# Patient Record
Sex: Male | Born: 1975 | Hispanic: No | Marital: Married | Smoking: Former smoker
Health system: Southern US, Community
[De-identification: ages and names within clinical notes are randomized; demographics above are authoritative.]

---

## 2003-03-14 ENCOUNTER — Encounter: Admission: RE | Admit: 2003-03-14 | Discharge: 2003-03-14 | Payer: Self-pay | Admitting: Specialist

## 2003-03-14 ENCOUNTER — Encounter: Payer: Self-pay | Admitting: Specialist

## 2015-05-16 ENCOUNTER — Encounter (HOSPITAL_COMMUNITY)
Admission: EM | Disposition: A | Discharge status: Home / Self Care | Payer: Self-pay | Source: Home / Self Care | Attending: Emergency Medicine

## 2015-05-16 ENCOUNTER — Emergency Department (HOSPITAL_COMMUNITY): Payer: Worker's Compensation | Admitting: Certified Registered Nurse Anesthetist

## 2015-05-16 ENCOUNTER — Emergency Department (HOSPITAL_COMMUNITY): Payer: Worker's Compensation

## 2015-05-16 ENCOUNTER — Encounter (HOSPITAL_COMMUNITY): Payer: Self-pay | Admitting: *Deleted

## 2015-05-16 ENCOUNTER — Ambulatory Visit (HOSPITAL_COMMUNITY)
Admission: EM | Admit: 2015-05-16 | Discharge: 2015-05-16 | Disposition: A | Discharge status: Home / Self Care | Payer: Worker's Compensation | Attending: Emergency Medicine | Admitting: Emergency Medicine

## 2015-05-16 DIAGNOSIS — S61214A Laceration without foreign body of right ring finger without damage to nail, initial encounter: Secondary | ICD-10-CM | POA: Diagnosis not present

## 2015-05-16 DIAGNOSIS — S61212A Laceration without foreign body of right middle finger without damage to nail, initial encounter: Secondary | ICD-10-CM | POA: Diagnosis not present

## 2015-05-16 DIAGNOSIS — Z87891 Personal history of nicotine dependence: Secondary | ICD-10-CM | POA: Diagnosis not present

## 2015-05-16 DIAGNOSIS — S61216A Laceration without foreign body of right little finger without damage to nail, initial encounter: Secondary | ICD-10-CM | POA: Diagnosis not present

## 2015-05-16 DIAGNOSIS — S61411A Laceration without foreign body of right hand, initial encounter: Secondary | ICD-10-CM

## 2015-05-16 DIAGNOSIS — W319XXA Contact with unspecified machinery, initial encounter: Secondary | ICD-10-CM | POA: Insufficient documentation

## 2015-05-16 DIAGNOSIS — S62616A Displaced fracture of proximal phalanx of right little finger, initial encounter for closed fracture: Secondary | ICD-10-CM | POA: Insufficient documentation

## 2015-05-16 DIAGNOSIS — S61111A Laceration without foreign body of right thumb with damage to nail, initial encounter: Secondary | ICD-10-CM | POA: Diagnosis not present

## 2015-05-16 DIAGNOSIS — S63692A Other sprain of right middle finger, initial encounter: Secondary | ICD-10-CM | POA: Insufficient documentation

## 2015-05-16 DIAGNOSIS — Z23 Encounter for immunization: Secondary | ICD-10-CM | POA: Insufficient documentation

## 2015-05-16 DIAGNOSIS — S61011A Laceration without foreign body of right thumb without damage to nail, initial encounter: Secondary | ICD-10-CM | POA: Diagnosis present

## 2015-05-16 HISTORY — PX: I&D EXTREMITY: SHX5045

## 2015-05-16 LAB — CBC WITH DIFFERENTIAL/PLATELET
BASOS ABS: 0 10*3/uL (ref 0.0–0.1)
BASOS PCT: 0 %
EOS ABS: 0.2 10*3/uL (ref 0.0–0.7)
EOS PCT: 4 %
HCT: 43 % (ref 39.0–52.0)
Hemoglobin: 14.2 g/dL (ref 13.0–17.0)
Lymphocytes Relative: 56 %
Lymphs Abs: 3.7 10*3/uL (ref 0.7–4.0)
MCH: 28.5 pg (ref 26.0–34.0)
MCHC: 33 g/dL (ref 30.0–36.0)
MCV: 86.3 fL (ref 78.0–100.0)
MONO ABS: 0.4 10*3/uL (ref 0.1–1.0)
Monocytes Relative: 6 %
Neutro Abs: 2.2 10*3/uL (ref 1.7–7.7)
Neutrophils Relative %: 34 %
PLATELETS: 201 10*3/uL (ref 150–400)
RBC: 4.98 MIL/uL (ref 4.22–5.81)
RDW: 13 % (ref 11.5–15.5)
WBC: 6.6 10*3/uL (ref 4.0–10.5)

## 2015-05-16 LAB — BASIC METABOLIC PANEL
Anion gap: 10 (ref 5–15)
BUN: 14 mg/dL (ref 6–20)
CHLORIDE: 101 mmol/L (ref 101–111)
CO2: 26 mmol/L (ref 22–32)
CREATININE: 0.9 mg/dL (ref 0.61–1.24)
Calcium: 9.8 mg/dL (ref 8.9–10.3)
GFR calc Af Amer: >60 mL/min (ref >60)
GFR calc non Af Amer: >60 mL/min (ref >60)
Glucose, Bld: 114 mg/dL — ABNORMAL HIGH (ref 65–99)
Potassium: 3.3 mmol/L — ABNORMAL LOW (ref 3.5–5.1)
SODIUM: 137 mmol/L (ref 135–145)

## 2015-05-16 SURGERY — IRRIGATION AND DEBRIDEMENT EXTREMITY
Anesthesia: General | Site: Hand | Laterality: Right

## 2015-05-16 MED ORDER — FENTANYL CITRATE (PF) 250 MCG/5ML IJ SOLN
INTRAMUSCULAR | Status: AC
  Filled 2015-05-16: qty 5

## 2015-05-16 MED ORDER — LACTATED RINGERS IV SOLN
INTRAVENOUS | Status: DC
  Administered 2015-05-16: 17:00:00 via INTRAVENOUS

## 2015-05-16 MED ORDER — FENTANYL CITRATE (PF) 250 MCG/5ML IJ SOLN
INTRAMUSCULAR | Status: DC | PRN
  Administered 2015-05-16: 100 ug via INTRAVENOUS
  Administered 2015-05-16: 150 ug via INTRAVENOUS

## 2015-05-16 MED ORDER — HYDROMORPHONE HCL 1 MG/ML IJ SOLN
0.2500 mg | INTRAMUSCULAR | Status: DC | PRN
  Administered 2015-05-16 (×3): 0.5 mg via INTRAVENOUS

## 2015-05-16 MED ORDER — ONDANSETRON HCL 4 MG/2ML IJ SOLN
INTRAMUSCULAR | Status: AC
  Filled 2015-05-16: qty 2

## 2015-05-16 MED ORDER — TETANUS-DIPHTH-ACELL PERTUSSIS 5-2.5-18.5 LF-MCG/0.5 IM SUSP
0.5000 mL | Freq: Once | INTRAMUSCULAR | Status: AC
  Administered 2015-05-16: 0.5 mL via INTRAMUSCULAR
  Filled 2015-05-16: qty 0.5

## 2015-05-16 MED ORDER — LACTATED RINGERS IV SOLN
INTRAVENOUS | Status: DC | PRN
  Administered 2015-05-16 (×2): via INTRAVENOUS

## 2015-05-16 MED ORDER — PROPOFOL 10 MG/ML IV BOLUS
INTRAVENOUS | Status: DC | PRN
  Administered 2015-05-16: 200 mg via INTRAVENOUS

## 2015-05-16 MED ORDER — ONDANSETRON HCL 4 MG/2ML IJ SOLN
INTRAMUSCULAR | Status: DC | PRN
  Administered 2015-05-16: 4 mg via INTRAVENOUS

## 2015-05-16 MED ORDER — PROPOFOL 10 MG/ML IV BOLUS
INTRAVENOUS | Status: AC
  Filled 2015-05-16: qty 20

## 2015-05-16 MED ORDER — LIDOCAINE HCL (CARDIAC) 20 MG/ML IV SOLN
INTRAVENOUS | Status: AC
  Filled 2015-05-16: qty 5

## 2015-05-16 MED ORDER — LIDOCAINE HCL (CARDIAC) 20 MG/ML IV SOLN
INTRAVENOUS | Status: DC | PRN
  Administered 2015-05-16: 100 mg via INTRAVENOUS

## 2015-05-16 MED ORDER — HYDROMORPHONE HCL 1 MG/ML IJ SOLN
INTRAMUSCULAR | Status: AC
  Filled 2015-05-16: qty 1

## 2015-05-16 MED ORDER — MIDAZOLAM HCL 2 MG/2ML IJ SOLN
INTRAMUSCULAR | Status: AC
  Filled 2015-05-16: qty 4

## 2015-05-16 MED ORDER — CEFAZOLIN SODIUM 1-5 GM-% IV SOLN
1.0000 g | Freq: Once | INTRAVENOUS | Status: AC
  Administered 2015-05-16: 1 g via INTRAVENOUS
  Administered 2015-05-16: 2 g via INTRAVENOUS
  Filled 2015-05-16: qty 50

## 2015-05-16 MED ORDER — BUPIVACAINE HCL (PF) 0.25 % IJ SOLN
INTRAMUSCULAR | Status: DC | PRN
Start: 2015-05-16 — End: 2015-05-16
  Administered 2015-05-16: 8 mL

## 2015-05-16 MED ORDER — OXYCODONE-ACETAMINOPHEN 5-325 MG PO TABS: 1.0000 | ORAL_TABLET | ORAL | Status: AC | PRN

## 2015-05-16 MED ORDER — SODIUM CHLORIDE 0.9 % IR SOLN
Status: DC | PRN
  Administered 2015-05-16: 3000 mL
  Administered 2015-05-16: 1000 mL

## 2015-05-16 MED ORDER — SCOPOLAMINE 1 MG/3DAYS TD PT72
MEDICATED_PATCH | TRANSDERMAL | Status: DC | PRN
  Administered 2015-05-16: 1 via TRANSDERMAL

## 2015-05-16 MED ORDER — HYDROMORPHONE HCL 1 MG/ML IJ SOLN
1.0000 mg | Freq: Once | INTRAMUSCULAR | Status: AC
  Administered 2015-05-16: 1 mg via INTRAVENOUS
  Filled 2015-05-16: qty 1

## 2015-05-16 MED ORDER — MEPERIDINE HCL 25 MG/ML IJ SOLN: 6.2500 mg | INTRAMUSCULAR | Status: DC | PRN

## 2015-05-16 MED ORDER — BUPIVACAINE HCL (PF) 0.25 % IJ SOLN
INTRAMUSCULAR | Status: AC
  Filled 2015-05-16: qty 30

## 2015-05-16 MED ORDER — SCOPOLAMINE 1 MG/3DAYS TD PT72
MEDICATED_PATCH | TRANSDERMAL | Status: AC
  Filled 2015-05-16: qty 1

## 2015-05-16 MED ORDER — SUCCINYLCHOLINE CHLORIDE 20 MG/ML IJ SOLN
INTRAMUSCULAR | Status: AC
  Filled 2015-05-16: qty 1

## 2015-05-16 MED ORDER — MIDAZOLAM HCL 2 MG/2ML IJ SOLN
INTRAMUSCULAR | Status: DC | PRN
  Administered 2015-05-16: 2 mg via INTRAVENOUS

## 2015-05-16 MED ORDER — ONDANSETRON HCL 4 MG/2ML IJ SOLN
4.0000 mg | Freq: Once | INTRAMUSCULAR | Status: AC
  Administered 2015-05-16: 4 mg via INTRAVENOUS
  Filled 2015-05-16: qty 2

## 2015-05-16 MED ORDER — PROMETHAZINE HCL 25 MG/ML IJ SOLN: 6.2500 mg | INTRAMUSCULAR | Status: DC | PRN

## 2015-05-16 MED ORDER — SUCCINYLCHOLINE CHLORIDE 20 MG/ML IJ SOLN
INTRAMUSCULAR | Status: DC | PRN
  Administered 2015-05-16: 100 mg via INTRAVENOUS

## 2015-05-16 SURGICAL SUPPLY — 64 items
BANDAGE ELASTIC 3 VELCRO ST LF (GAUZE/BANDAGES/DRESSINGS) ×2 IMPLANT
BANDAGE ELASTIC 4 VELCRO ST LF (GAUZE/BANDAGES/DRESSINGS) ×2 IMPLANT
BNDG CMPR 9X4 STRL LF SNTH (GAUZE/BANDAGES/DRESSINGS)
BNDG CONFORM 2 STRL LF (GAUZE/BANDAGES/DRESSINGS) IMPLANT
BNDG ESMARK 4X9 LF (GAUZE/BANDAGES/DRESSINGS) IMPLANT
BNDG GAUZE ELAST 4 BULKY (GAUZE/BANDAGES/DRESSINGS) ×2 IMPLANT
CORDS BIPOLAR (ELECTRODE) ×2 IMPLANT
COVER SURGICAL LIGHT HANDLE (MISCELLANEOUS) ×4 IMPLANT
CUFF TOURNIQUET SINGLE 18IN (TOURNIQUET CUFF) ×2 IMPLANT
DECANTER SPIKE VIAL GLASS SM (MISCELLANEOUS) ×2 IMPLANT
DRAIN PENROSE 1/4X12 LTX STRL (WOUND CARE) IMPLANT
DRAPE SURG 17X23 STRL (DRAPES) ×2 IMPLANT
DRSG PAD ABDOMINAL 8X10 ST (GAUZE/BANDAGES/DRESSINGS) IMPLANT
DURAPREP 26ML APPLICATOR (WOUND CARE) IMPLANT
ELECT REM PT RETURN 9FT ADLT (ELECTROSURGICAL)
ELECTRODE REM PT RTRN 9FT ADLT (ELECTROSURGICAL) IMPLANT
GAUZE PACKING IODOFORM 1/4X5 (PACKING) IMPLANT
GAUZE SPONGE 4X4 12PLY STRL (GAUZE/BANDAGES/DRESSINGS) IMPLANT
GAUZE XEROFORM 1X8 LF (GAUZE/BANDAGES/DRESSINGS) IMPLANT
GAUZE XEROFORM 5X9 LF (GAUZE/BANDAGES/DRESSINGS) ×2 IMPLANT
GLOVE SURG SYN 8.0 (GLOVE) ×4 IMPLANT
GOWN STRL REUS W/ TWL LRG LVL3 (GOWN DISPOSABLE) ×1 IMPLANT
GOWN STRL REUS W/ TWL XL LVL3 (GOWN DISPOSABLE) ×1 IMPLANT
GOWN STRL REUS W/TWL LRG LVL3 (GOWN DISPOSABLE) ×2
GOWN STRL REUS W/TWL XL LVL3 (GOWN DISPOSABLE) ×2
HANDPIECE INTERPULSE COAX TIP (DISPOSABLE)
K-WIRE 1.1 (WIRE) ×4
K-WIRE FX150X1.1XTROC TIP (WIRE) ×2
KIT BASIN OR (CUSTOM PROCEDURE TRAY) ×2 IMPLANT
KIT ROOM TURNOVER OR (KITS) ×2 IMPLANT
KWIRE FX150X1.1XTROC TIP (WIRE) ×2 IMPLANT
MANIFOLD NEPTUNE II (INSTRUMENTS) ×2 IMPLANT
NEEDLE HYPO 25GX1X1/2 BEV (NEEDLE) IMPLANT
NEEDLE HYPO 25X1 1.5 SAFETY (NEEDLE) IMPLANT
NS IRRIG 1000ML POUR BTL (IV SOLUTION) ×2 IMPLANT
PACK ORTHO EXTREMITY (CUSTOM PROCEDURE TRAY) ×2 IMPLANT
PAD ARMBOARD 7.5X6 YLW CONV (MISCELLANEOUS) ×4 IMPLANT
PAD CAST 3X4 CTTN HI CHSV (CAST SUPPLIES) ×1 IMPLANT
PAD CAST 4YDX4 CTTN HI CHSV (CAST SUPPLIES) ×1 IMPLANT
PADDING CAST COTTON 3X4 STRL (CAST SUPPLIES) ×2
PADDING CAST COTTON 4X4 STRL (CAST SUPPLIES) ×2
PLATE T 1.5MM 11H 40MM (Plate) ×2 IMPLANT
SCREW 1.5X10MM (Screw) ×4 IMPLANT
SCREW 1.5X12MM (Screw) ×2 IMPLANT
SCREW 1.5X8MM (Screw) ×4 IMPLANT
SET HNDPC FAN SPRY TIP SCT (DISPOSABLE) IMPLANT
SPLINT PLASTER CAST XFAST 4X15 (CAST SUPPLIES) ×1 IMPLANT
SPLINT PLASTER XTRA FAST SET 4 (CAST SUPPLIES) ×1
SPONGE LAP 18X18 X RAY DECT (DISPOSABLE) IMPLANT
SUCTION FRAZIER TIP 10 FR DISP (SUCTIONS) ×2 IMPLANT
SUT CHROMIC 6 0 PS 4 (SUTURE) ×2 IMPLANT
SUT MERSILENE 4 0 P 3 (SUTURE) ×2 IMPLANT
SUT VIC AB 2-0 FS1 27 (SUTURE) ×2 IMPLANT
SUT VICRYL 6 0 UNDY PS 6 (SUTURE) ×2 IMPLANT
SUT VICRYL RAPIDE 4/0 PS 2 (SUTURE) ×12 IMPLANT
SYR 20CC LL (SYRINGE) IMPLANT
SYR CONTROL 10ML LL (SYRINGE) IMPLANT
TOWEL OR 17X24 6PK STRL BLUE (TOWEL DISPOSABLE) ×2 IMPLANT
TOWEL OR 17X26 10 PK STRL BLUE (TOWEL DISPOSABLE) ×2 IMPLANT
TUBE ANAEROBIC SPECIMEN COL (MISCELLANEOUS) IMPLANT
TUBE CONNECTING 12X1/4 (SUCTIONS) ×4 IMPLANT
UNDERPAD 30X30 INCONTINENT (UNDERPADS AND DIAPERS) ×2 IMPLANT
WATER STERILE IRR 1000ML POUR (IV SOLUTION) ×2 IMPLANT
YANKAUER SUCT BULB TIP NO VENT (SUCTIONS) ×2 IMPLANT

## 2015-05-16 NOTE — Anesthesia Preprocedure Evaluation (Addendum)
Anesthesia Evaluation  Patient identified by MRN, date of birth, ID band Patient awake    Reviewed: Allergy & Precautions, NPO status , Patient's Chart, lab work & pertinent test results  Airway Mallampati: II  TM Distance: >3 FB Neck ROM: Full    Dental no notable dental hx. (+) Teeth Intact, Dental Advisory Given   Pulmonary former smoker,    Pulmonary exam normal breath sounds clear to auscultation       Cardiovascular Normal cardiovascular exam Rhythm:Regular Rate:Normal     Neuro/Psych    GI/Hepatic   Endo/Other    Renal/GU      Musculoskeletal   Abdominal   Peds  Hematology   Anesthesia Other Findings   Reproductive/Obstetrics                           Anesthesia Physical Anesthesia Plan  ASA: II and emergent  Anesthesia Plan: General   Post-op Pain Management:    Induction: Intravenous  Airway Management Planned: Oral ETT  Additional Equipment:   Intra-op Plan:   Post-operative Plan: Extubation in OR  Informed Consent: I have reviewed the patients History and Physical, chart, labs and discussed the procedure including the risks, benefits and alternatives for the proposed anesthesia with the patient or authorized representative who has indicated his/her understanding and acceptance.   Dental advisory given  Plan Discussed with: CRNA  Anesthesia Plan Comments:         Anesthesia Quick Evaluation

## 2015-05-16 NOTE — Anesthesia Procedure Notes (Signed)
Procedure Name: Intubation Date/Time: 05/16/2015 5:37 PM Performed by: Alanda AmassFRIEDMAN, Na Waldrip A Pre-anesthesia Checklist: Patient identified, Emergency Drugs available, Suction available, Patient being monitored and Timeout performed Patient Re-evaluated:Patient Re-evaluated prior to inductionOxygen Delivery Method: Circle system utilized Preoxygenation: Pre-oxygenation with 100% oxygen Intubation Type: IV induction, Rapid sequence and Cricoid Pressure applied Tube type: Oral Tube size: 7.5 mm Number of attempts: 1 Airway Equipment and Method: Stylet Placement Confirmation: ETT inserted through vocal cords under direct vision,  positive ETCO2 and breath sounds checked- equal and bilateral Secured at: 22 cm Tube secured with: Tape Dental Injury: Teeth and Oropharynx as per pre-operative assessment

## 2015-05-16 NOTE — Transfer of Care (Signed)
Immediate Anesthesia Transfer of Care Note  Patient: William Case  Procedure(s) Performed: Procedure(s): EXPLORE AS NEEDED HAND, ORIF RIGHT PROXIMAL FIFTH PHALANX FRACTURE (Right)  Patient Location: PACU  Anesthesia Type:General  Level of Consciousness: sedated  Airway & Oxygen Therapy: Patient Spontanous Breathing and Patient connected to face mask oxygen  Post-op Assessment: Report given to RN and Post -op Vital signs reviewed and stable  Post vital signs: Reviewed and stable  Last Vitals:  Filed Vitals:   05/16/15 1700  BP: 141/73  Pulse: 78  Temp:   Resp:     Complications: No apparent anesthesia complications

## 2015-05-16 NOTE — Op Note (Signed)
[name], William Case             ACCOUNT NO.:  [id number]  MEDICAL RECORD NO.:  [id number]  LOCATION:  [facility]                         FACILITY:  [facility]  PHYSICIAN:  Artist Pais. Astraea Gaughran, M.D.DATE OF BIRTH:  1976/06/18  DATE OF PROCEDURE:  05/16/2015 DATE OF DISCHARGE:                              OPERATIVE REPORT   PREOPERATIVE DIAGNOSIS:  Degloving injury, right hand involving thumb, long, ring, and small fingers.  POSTOPERATIVE DIAGNOSIS:  Degloving injury, right hand involving thumb, long, ring, and small fingers.  PROCEDURES:  I and D above with exploration and repair, thumb, long, ring, and small.  Thumb is exploration with nail bed repair and complex wound closure.  Index nothing.  Long finger is complex wound closure with repair of A2 and A4 pulleys.  Ring is simple wound closure, and small is complex wound closure with open reduction internal fixation of displaced proximal phalangeal fracture with 1.5 mm T-plate.  SURGEON:  Artist Pais. Mina Marble, M.D.  ASSISTANT:  None.  ANESTHESIA:  General.  TOURNIQUET TIME:  2 hours and 5 minutes.  COMPLICATIONS:  No complications.  DRAINS:  No drains.  DESCRIPTION OF PROCEDURE:  The patient was taken to the operating suite after induction of general anesthetic.  Right upper extremity was prepped and draped in sterile fashion.  An Esmarch was used to exsanguinate the limb.  Tourniquet was then inflated to 250 mmHg.  At this point in time, we explored the right hand.  The thumb had a nail plate injury with avulsion of the nail plate from under the eponychial fold, and a nail bed laceration transversely in the distal third, and a distally based flap of skin volarly with intact neurovascular bundles, intact flexor tendon, complex wound.  This wound was irrigated.  We loosely approximated the skin edges with 4-0 Vicryl Rapide.  We removed the nail plate from under the eponychial fold, nail bed, repaired the nail bed with 6-0 Vicryl,  sutured the nail plate back onto the eponychial fold with 4-0 Vicryl Rapide.  Index finger had no injury. Long finger had a complex wound with disruption of both the A4 and A2 pulleys.  We thoroughly irrigated this wound.  Neurovascular bundles were identified and retracted.  The pulley was repaired with 4-0 Mersilene; and then, a complex wound closure was performed using 4-0 Vicryl Rapide.  The ring finger had a complex wound along the base of the proximal phalanx which was explored.  The ulnar neurovascular bundle was intact, and this was repaired with 4-0 Vicryl Rapide.  The small finger had a complex volar laceration which was explored.  The neurovascular bundle was intact.  There was an open fracture of the proximal phalanx.  This wound was then also closed with 4-0 Vicryl Rapide.  Multiple attempts were made to try and reduce the fracture with closed percutaneous pinning, but the fracture fragments were not reducible.  We then made an open incision dorsally over the proximal phalanx.  Dissection was carried down the extensor mechanism.  We carefully retracted the extensor mechanism to the ulnar side and placed a 1.5 mm titanium T-plate with three screws proximally and two distally across the fracture site under direct fluoroscopic guidance.  We  also used a 0 Vicryl suture as a cerclage suture to hold the fracture fragments together.  Intraoperative fluoroscopy revealed adequate reduction in AP, lateral, and oblique view.  This wound was also closed with 4-0 Vicryl Rapide.  Xeroform, 4x4 fluffs, and an ulnar gutter splint was applied.  The patient tolerated all procedures well, was sent to the recovery room in stable fashion.     Artist PaisMatthew A. Mina MarbleWeingold, M.D.     MAW/MEDQ  D:  05/16/2015  T:  05/16/2015  Job:  [id number]

## 2015-05-16 NOTE — Anesthesia Postprocedure Evaluation (Signed)
Anesthesia Post Note  Patient: William Case  Procedure(s) Performed: Procedure(s) (LRB): EXPLORE AS NEEDED HAND, ORIF RIGHT PROXIMAL FIFTH PHALANX FRACTURE (Right)  Anesthesia type: General  Patient location: PACU  Post pain: Pain level controlled  Post assessment: Post-op Vital signs reviewed  Last Vitals: BP 140/88 mmHg  Pulse 96  Temp(Src) 36.5 C (Oral)  Resp 16  Ht 5\' 8"  (1.727 m)  Wt 220 lb (99.791 kg)  BMI 33.46 kg/m2  SpO2 93%  Post vital signs: Reviewed  Level of consciousness: sedated  Complications: No apparent anesthesia complications

## 2015-05-16 NOTE — ED Notes (Signed)
Pt states his R hand was caught in a metal banding machine.  States tip of right thumb came "apart".  Multiple lacs to R thumb and R index finger.  Cannot remember last tetanus shot.

## 2015-05-16 NOTE — Op Note (Signed)
See note [id number]

## 2015-05-16 NOTE — Consult Note (Signed)
Reason for Consult:right hand traumatic injury Referring Physician: Keene Case is an 39 y.o. male.  HPI: s/p work injury to right hand with complex bone and soft tissue involvement  History reviewed. No pertinent past medical history.  History reviewed. No pertinent past surgical history.  No family history on file.  Social History:  reports that he has quit smoking. He does not have any smokeless tobacco history on file. He reports that he drinks alcohol. He reports that he does not use illicit drugs.  Allergies:  Allergies  Allergen Reactions  . Vicodin [Hydrocodone-Acetaminophen]     Medications: Scheduled:   Results for orders placed or performed during the hospital encounter of 05/16/15 (from the past 48 hour(s))  Basic metabolic panel     Status: Abnormal   Collection Time: 05/16/15  3:00 PM  Result Value Ref Range   Sodium 137 135 - 145 mmol/L   Potassium 3.3 (L) 3.5 - 5.1 mmol/L   Chloride 101 101 - 111 mmol/L   CO2 26 22 - 32 mmol/L   Glucose, Bld 114 (H) 65 - 99 mg/dL   BUN 14 6 - 20 mg/dL   Creatinine, Ser 0.90 0.61 - 1.24 mg/dL   Calcium 9.8 8.9 - 10.3 mg/dL   GFR calc non Af Amer >60 >60 mL/min   GFR calc Af Amer >60 >60 mL/min    Comment: (NOTE) The eGFR has been calculated using the CKD EPI equation. This calculation has not been validated in all clinical situations. eGFR's persistently <60 mL/min signify possible Chronic Kidney Disease.    Anion gap 10 5 - 15  CBC with Differential     Status: None   Collection Time: 05/16/15  3:00 PM  Result Value Ref Range   WBC 6.6 4.0 - 10.5 K/uL   RBC 4.98 4.22 - 5.81 MIL/uL   Hemoglobin 14.2 13.0 - 17.0 g/dL   HCT 43.0 39.0 - 52.0 %   MCV 86.3 78.0 - 100.0 fL   MCH 28.5 26.0 - 34.0 pg   MCHC 33.0 30.0 - 36.0 g/dL   RDW 13.0 11.5 - 15.5 %   Platelets 201 150 - 400 K/uL   Neutrophils Relative % 34 %   Neutro Abs 2.2 1.7 - 7.7 K/uL   Lymphocytes Relative 56 %   Lymphs Abs 3.7 0.7 - 4.0 K/uL    Monocytes Relative 6 %   Monocytes Absolute 0.4 0.1 - 1.0 K/uL   Eosinophils Relative 4 %   Eosinophils Absolute 0.2 0.0 - 0.7 K/uL   Basophils Relative 0 %   Basophils Absolute 0.0 0.0 - 0.1 K/uL    Dg Hand Complete Right  05/16/2015  CLINICAL DATA:  39 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 EXAM: RIGHT HAND - COMPLETE 3+ VIEW COMPARISON:  None. FINDINGS: Acute comminuted fracture of the proximal phalanx of the right fifth finger. Soft tissue swelling. Lateral displacement of the distal fracture fragments. No radiopaque foreign bodies identified. Overlying dressing material. IMPRESSION: Acute comminuted fracture of the proximal phalanx of the right fifth finger, with lateral displacement. Associated soft tissue swelling. Signed, William Case. William Newport, DO [facility] Electronically Signed   By: Corrie Mckusick D.O.   On: 05/16/2015 15:49    Review of Systems  All other systems reviewed and are negative.  Blood pressure 155/95, pulse 81, temperature 98.7 F (37.1 C), temperature source Oral, resp. rate 22, height 5' 8"  (1.727 m), weight 99.791 kg (220 lb),  SpO2 98 %. Physical Exam  Constitutional: He is oriented to person, place, and time. He appears well-developed and well-nourished.  HENT:  Head: Normocephalic and atraumatic.  Cardiovascular: Normal rate.   Respiratory: Effort normal.  Musculoskeletal:       Right hand: He exhibits decreased range of motion, tenderness and laceration.  Multiple soft tissue injuries to right hand with xr positive for right small proximal phalanx fracture  Neurological: He is alert and oriented to person, place, and time.  Skin: Skin is warm.  Psychiatric: He has a normal mood and affect. His behavior is normal. Judgment and thought content normal.    Assessment/Plan: As above  Plan explore and repair as neeeded  William Case A 05/16/2015, 4:35 PM

## 2015-05-16 NOTE — ED Provider Notes (Signed)
CSN: [id number]     Arrival date & time 05/16/15  1447 History   First MD Initiated Contact with Patient 05/16/15 1454     Chief Complaint  Patient presents with  . Hand Pain     (Consider location/radiation/quality/duration/timing/severity/associated sxs/prior Treatment) Patient is a 39 y.o. male presenting with hand injury.  Hand Injury Location:  Hand Time since incident:  15 minutes Injury: yes   Mechanism of injury comment:  Crushed by machinery Hand location:  R hand Pain details:    Quality:  Aching   Severity:  Severe   Onset quality:  Sudden   Timing:  Constant   Progression:  Unchanged Chronicity:  New Handedness:  Right-handed Tetanus status:  Unknown Prior injury to area:  No Relieved by:  Nothing Worsened by:  Movement Ineffective treatments:  None tried Associated symptoms: numbness, stiffness and tingling   Associated symptoms: no back pain and no fever     History reviewed. No pertinent past medical history. History reviewed. No pertinent past surgical history. No family history on file. Social History  Substance Use Topics  . Smoking status: Former Smoker -- 0.00 packs/day  . Smokeless tobacco: None  . Alcohol Use: Yes     Comment: week-end drinker    Review of Systems  Constitutional: Negative for fever.  Musculoskeletal: Positive for stiffness. Negative for back pain.  All other systems reviewed and are negative.     Allergies  Vicodin  Home Medications   Prior to Admission medications   Medication Sig Start Date End Date Taking? Authorizing Provider  Multiple Vitamins-Minerals (HAIR/SKIN/NAILS/BIOTIN) TABS Take 1 tablet by mouth daily.   Yes Historical Provider, MD  OVER THE COUNTER MEDICATION Place 1 drop into both eyes daily as needed (dry eyes). OTC lubricating eye drop   Yes Historical Provider, MD  oxyCODONE-acetaminophen (ROXICET) 5-325 MG tablet Take 1 tablet by mouth every 4 (four) hours as needed for severe pain. 05/16/15    Dairl PonderMatthew Weingold, MD   BP 140/88 mmHg  Pulse 96  Temp(Src) 97.7 F (36.5 C) (Oral)  Resp 16  Ht 5\' 8"  (1.727 m)  Wt 220 lb (99.791 kg)  BMI 33.46 kg/m2  SpO2 93% Physical Exam  Constitutional: He is oriented to person, place, and time. He appears well-developed and well-nourished.  HENT:  Head: Normocephalic and atraumatic.  Eyes: Conjunctivae and EOM are normal.  Neck: Normal range of motion. Neck supple.  Cardiovascular: Normal rate, regular rhythm and normal heart sounds.   Pulmonary/Chest: Effort normal and breath sounds normal. No respiratory distress.  Abdominal: He exhibits no distension. There is no tenderness. There is no rebound and no guarding.  Musculoskeletal: Normal range of motion.  Unable to fully flex 3rd digit of R hand with exposed tendon.  Sensation impaired in thumb  Neurological: He is alert and oriented to person, place, and time.  Skin: Skin is warm and dry.  Vitals reviewed.       ED Course  Procedures (including critical care time) Labs Review Labs Reviewed  BASIC METABOLIC PANEL - Abnormal; Notable for the following:    Potassium 3.3 (*)    Glucose, Bld 114 (*)    All other components within normal limits  CBC WITH DIFFERENTIAL/PLATELET    Imaging Review Dg Hand Complete Right  05/16/2015  CLINICAL DATA:  39 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 EXAM: RIGHT HAND - COMPLETE 3+ VIEW COMPARISON:  None. FINDINGS: Acute comminuted fracture of the proximal phalanx of the right fifth finger.  Soft tissue swelling. Lateral displacement of the distal fracture fragments. No radiopaque foreign bodies identified. Overlying dressing material. IMPRESSION: Acute comminuted fracture of the proximal phalanx of the right fifth finger, with lateral displacement. Associated soft tissue swelling. Signed, Yvone Neu. Loreta Ave, DO [facility] Electronically Signed   By: Gilmer Mor D.O.   On:  05/16/2015 15:49   I have personally reviewed and evaluated these images and lab results as part of my medical decision-making.   EKG Interpretation None      MDM   Final diagnoses:  None    39 y.o. male without pertinent PMH presents with hand injury as above.  Consulted hand.  Taken OR.    I have reviewed all laboratory and imaging studies if ordered as above  1. Hand laceration     Mirian Mo, MD 05/17/15 [phone]

## 2015-05-16 NOTE — Progress Notes (Signed)
Orthopedic Tech Progress Note Patient Details:  William Case 10/03/1975 [id number] Applied arm sling to RUE. Ortho Devices Type of Ortho Device: Arm sling Ortho Device/Splint Location: RUE Ortho Device/Splint Interventions: Application   William Case, William Case 05/16/2015, 9:39 PM

## 2015-05-17 NOTE — Progress Notes (Signed)
D; Wasted Dilaudid 0.5mg  in the sink, witness by [place],M, NT.

## 2015-05-18 ENCOUNTER — Encounter (HOSPITAL_COMMUNITY): Payer: Self-pay | Admitting: Orthopedic Surgery

## 2017-01-01 IMAGING — DX DG HAND COMPLETE 3+V*R*
3 series · 3 of 3 positions shown · non-contrast
Comparison: None.

CLINICAL DATA: 39-year-old male with a history of industrial
accident.

EXAM:
RIGHT HAND - COMPLETE 3+ VIEW

[x hand pa right]
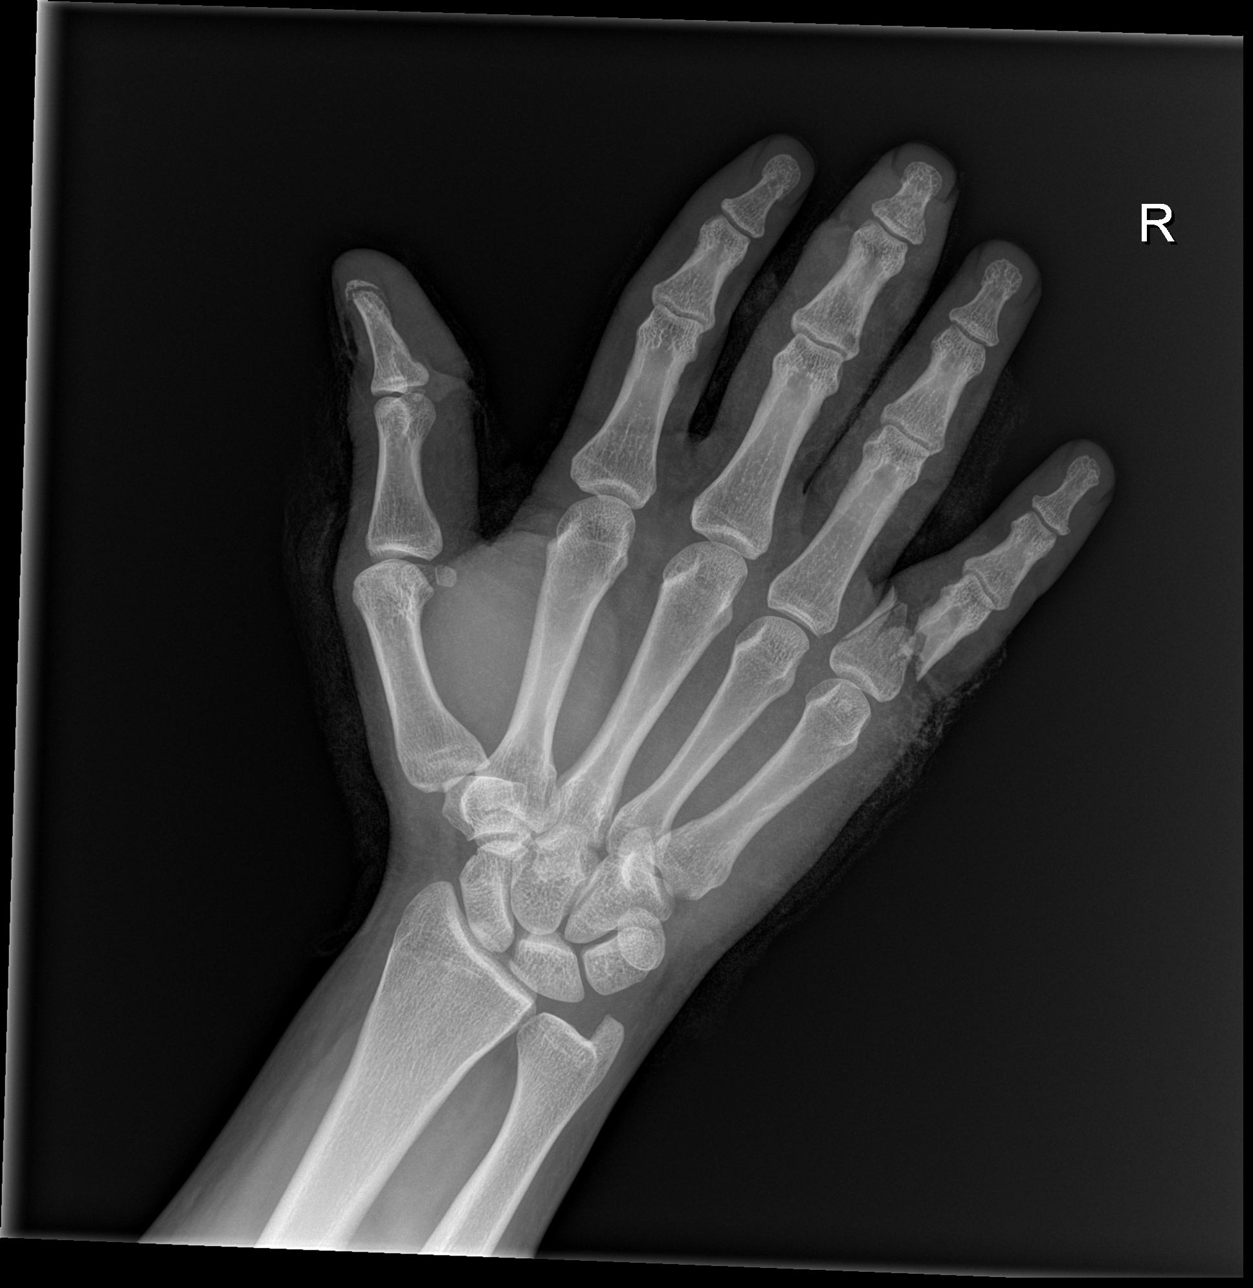

[x hand obl right]
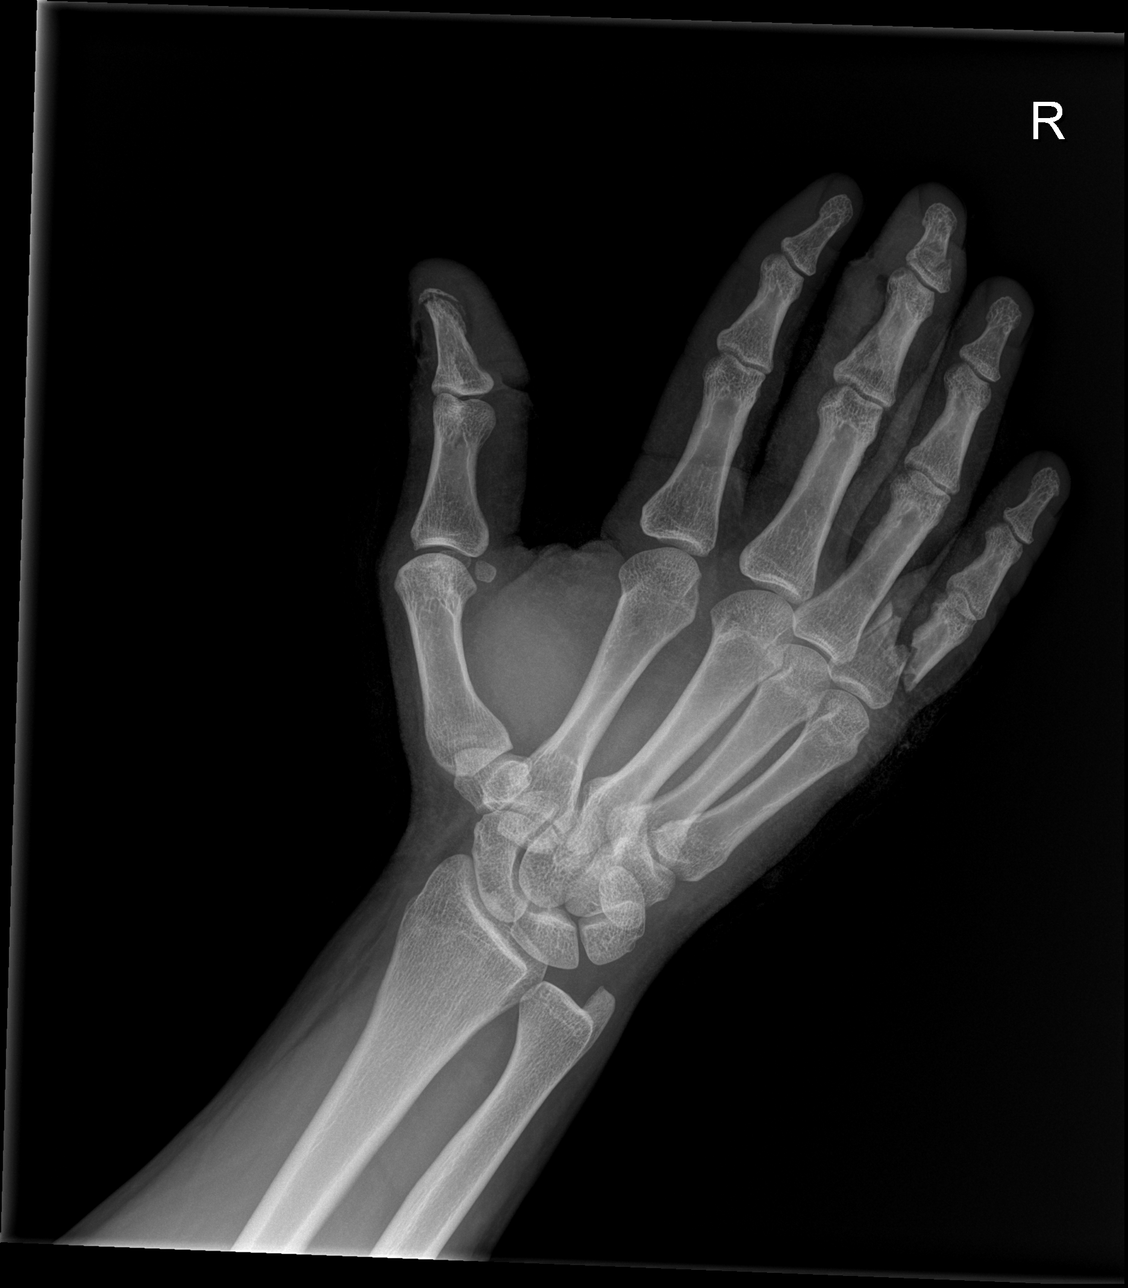

[x hand lat right]
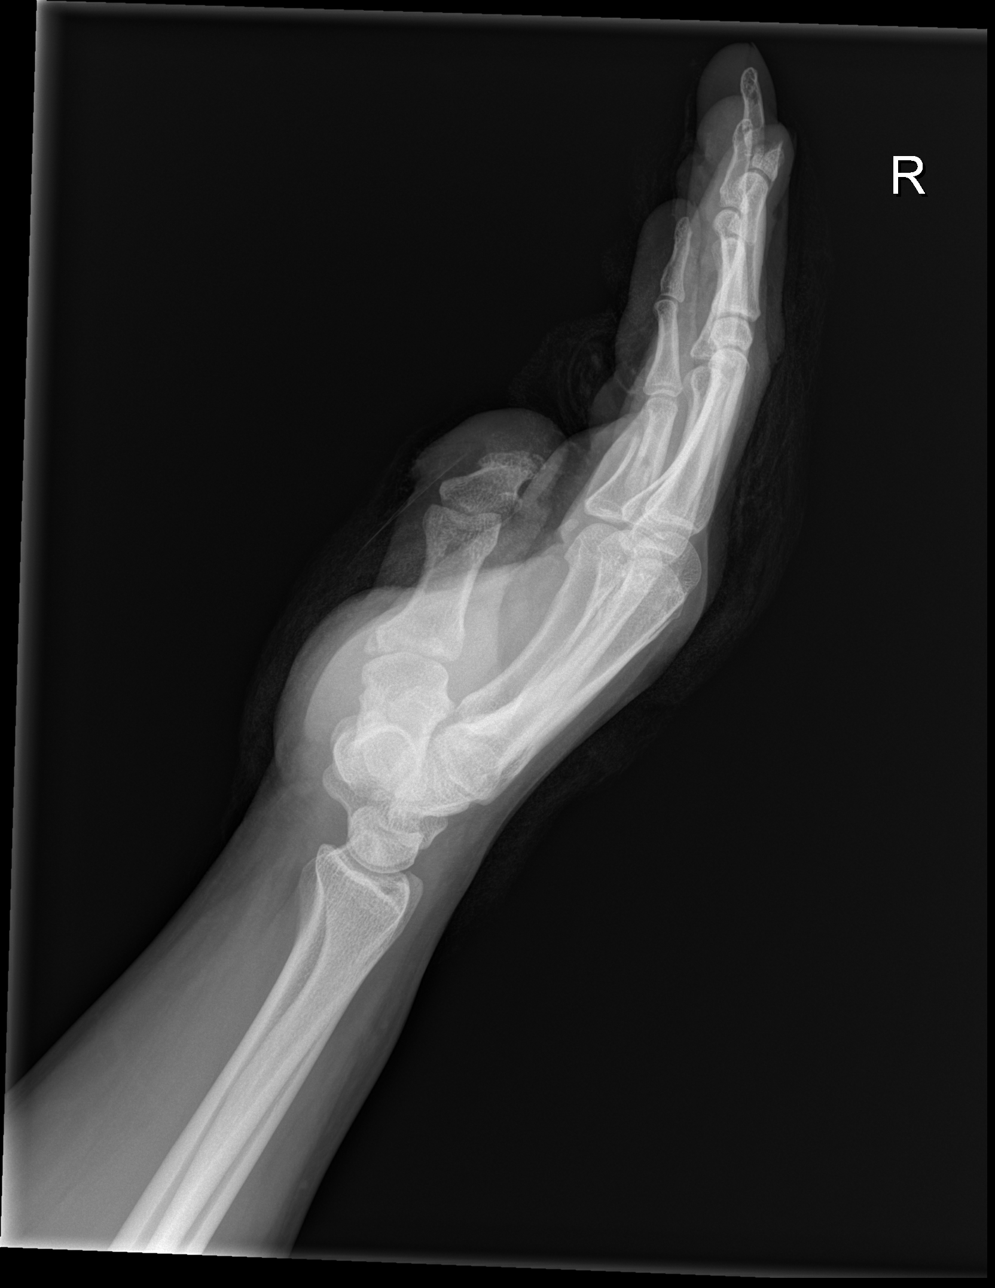

[3 of 3 positions shown; findings below may reference images not displayed]

FINDINGS: Acute comminuted fracture of the proximal phalanx of the right fifth
finger. Soft tissue swelling. Lateral displacement of the distal
fracture fragments.

No radiopaque foreign bodies identified. Overlying dressing
material.
IMPRESSION: Acute comminuted fracture of the proximal phalanx of the right fifth
finger, with lateral displacement. Associated soft tissue swelling.
